# Patient Record
Sex: Female | Born: 1935 | Race: White | Hispanic: No | Marital: Single | Smoking: Never smoker
Health system: Southern US, Community
[De-identification: ages and names within clinical notes are randomized; demographics above are authoritative.]

## PROBLEM LIST (undated history)

## (undated) DIAGNOSIS — I471 Supraventricular tachycardia: Secondary | ICD-10-CM

## (undated) DIAGNOSIS — G20 Parkinson's disease: Secondary | ICD-10-CM

## (undated) HISTORY — PX: BREAST SURGERY: SHX581

---

## 1999-08-26 ENCOUNTER — Encounter: Payer: Self-pay | Admitting: Family Medicine

## 1999-08-26 ENCOUNTER — Encounter: Admission: RE | Admit: 1999-08-26 | Discharge: 1999-08-26 | Payer: Self-pay | Admitting: Family Medicine

## 2018-04-10 ENCOUNTER — Emergency Department (HOSPITAL_BASED_OUTPATIENT_CLINIC_OR_DEPARTMENT_OTHER): Payer: Medicare Other

## 2018-04-10 ENCOUNTER — Encounter (HOSPITAL_BASED_OUTPATIENT_CLINIC_OR_DEPARTMENT_OTHER): Payer: Self-pay | Admitting: Emergency Medicine

## 2018-04-10 ENCOUNTER — Other Ambulatory Visit: Payer: Self-pay

## 2018-04-10 ENCOUNTER — Emergency Department (HOSPITAL_BASED_OUTPATIENT_CLINIC_OR_DEPARTMENT_OTHER)
Admission: EM | Admit: 2018-04-10 | Discharge: 2018-04-10 | Disposition: A | Discharge status: Home / Self Care | Payer: Medicare Other | Attending: Emergency Medicine | Admitting: Emergency Medicine

## 2018-04-10 DIAGNOSIS — I480 Paroxysmal atrial fibrillation: Secondary | ICD-10-CM | POA: Insufficient documentation

## 2018-04-10 DIAGNOSIS — R079 Chest pain, unspecified: Secondary | ICD-10-CM | POA: Diagnosis present

## 2018-04-10 DIAGNOSIS — Z79899 Other long term (current) drug therapy: Secondary | ICD-10-CM | POA: Diagnosis not present

## 2018-04-10 DIAGNOSIS — G20 Parkinson's disease: Secondary | ICD-10-CM | POA: Diagnosis not present

## 2018-04-10 HISTORY — DX: Supraventricular tachycardia: I47.1

## 2018-04-10 HISTORY — DX: Parkinson's disease: G20

## 2018-04-10 LAB — CBC WITH DIFFERENTIAL/PLATELET
Abs Immature Granulocytes: 0.02 10*3/uL (ref 0.00–0.07)
BASOS ABS: 0 10*3/uL (ref 0.0–0.1)
Basophils Relative: 1 %
Eosinophils Absolute: 0.2 10*3/uL (ref 0.0–0.5)
Eosinophils Relative: 3 %
HCT: 43 % (ref 36.0–46.0)
Hemoglobin: 13.6 g/dL (ref 12.0–15.0)
Immature Granulocytes: 0 %
Lymphocytes Relative: 29 %
Lymphs Abs: 1.9 10*3/uL (ref 0.7–4.0)
MCH: 30.7 pg (ref 26.0–34.0)
MCHC: 31.6 g/dL (ref 30.0–36.0)
MCV: 97.1 fL (ref 80.0–100.0)
Monocytes Absolute: 0.6 10*3/uL (ref 0.1–1.0)
Monocytes Relative: 10 %
Neutro Abs: 3.6 10*3/uL (ref 1.7–7.7)
Neutrophils Relative %: 57 %
Platelets: 190 10*3/uL (ref 150–400)
RBC: 4.43 MIL/uL (ref 3.87–5.11)
RDW: 11.9 % (ref 11.5–15.5)
WBC: 6.3 10*3/uL (ref 4.0–10.5)
nRBC: 0 % (ref 0.0–0.2)

## 2018-04-10 LAB — TROPONIN I

## 2018-04-10 LAB — COMPREHENSIVE METABOLIC PANEL
ALT: <5 U/L (ref 0–44)
AST: 24 U/L (ref 15–41)
Albumin: 3.9 g/dL (ref 3.5–5.0)
Alkaline Phosphatase: 70 U/L (ref 38–126)
Anion gap: 9 (ref 5–15)
BUN: 22 mg/dL (ref 8–23)
CHLORIDE: 104 mmol/L (ref 98–111)
CO2: 26 mmol/L (ref 22–32)
Calcium: 9.6 mg/dL (ref 8.9–10.3)
Creatinine, Ser: 0.85 mg/dL (ref 0.44–1.00)
GFR calc Af Amer: >60 mL/min (ref >60)
GFR calc non Af Amer: >60 mL/min (ref >60)
Glucose, Bld: 102 mg/dL — ABNORMAL HIGH (ref 70–99)
Potassium: 3.8 mmol/L (ref 3.5–5.1)
Sodium: 139 mmol/L (ref 135–145)
Total Bilirubin: 0.8 mg/dL (ref 0.3–1.2)
Total Protein: 7.6 g/dL (ref 6.5–8.1)

## 2018-04-10 LAB — PROTIME-INR
INR: 0.98
Prothrombin Time: 12.9 seconds (ref 11.4–15.2)

## 2018-04-10 MED ORDER — APIXABAN 2.5 MG PO TABS
2.5000 mg | ORAL_TABLET | Freq: Once | ORAL | Status: AC
  Administered 2018-04-10: 2.5 mg via ORAL
  Filled 2018-04-10: qty 1

## 2018-04-10 MED ORDER — APIXABAN 2.5 MG PO TABS: 2.5000 mg | ORAL_TABLET | Freq: Two times a day (BID) | ORAL | 0 refills | Status: AC

## 2018-04-10 NOTE — ED Triage Notes (Signed)
Reports chest pain which began today. Reports pain in her jaw as well.  States "heart rate felt irregular".

## 2018-04-10 NOTE — Discharge Instructions (Signed)
If palpitations start again and you start feeling weak and your heart rate is elevated you can try to take half of 1 of your Lopressor.  Take the blood thinner Eliquis twice a day.  You have gotten your first dose tonight so you can start tomorrow.  This is to prevent strokes.

## 2018-04-10 NOTE — ED Provider Notes (Signed)
[facility] EMERGENCY DEPARTMENT Provider Note   CSN: [id number] Arrival date & time: 04/10/18  1900     History   Chief Complaint Chief Complaint  Patient presents with  . Chest Pain    HPI Julia Tran is a 83 y.o. female.  Patient is an 83 year old female with a history of Parkinson's disease, SVT presenting today because of jaw and shoulder discomfort as well as shortness of breath and generalized weakness.  Patient states she had had a normal day today until about 6:00 when she was sitting in the doctor's office with her sister.  She was not at the doctor for herself but was there with her nephew for his appointment.  She states it was very cold and there and she had been sitting there for approximately an hour when she started feeling a discomfort and ache in both jaws that then moved into both shoulders with generalized weakness some mild sweating and felt like she could not get a full breath.  She got home and checked her blood pressure and found her heart rate to be in the 120s.  She does take metoprolol twice daily and has not missed any doses.  She denies any recent medication changes and no reasons for dehydration.  She has been eating and drinking normally has had no cough, congestion, fever, abdominal pain.  She denied any chest pain today.  Currently here she states her symptoms have resolved and she just feels tired.  She denies any urinary symptoms or diarrhea.  She takes no anticoagulation.  The history is provided by the patient and a relative.  Chest Pain   This is a new problem. The current episode started 1 to 2 hours ago. The problem occurs constantly. The problem has been resolved. Associated with: started while sitting in the doctors office. The pain is at a severity of 4/10. The pain is moderate. The quality of the pain is described as pressure-like. Radiates to: pain was in bilateral jaws and shoulders. Duration of episode(s) is 1 hour. Associated  symptoms include diaphoresis, palpitations, shortness of breath and weakness. Pertinent negatives include no cough, no fever, no leg pain, no lower extremity edema, no nausea, no orthopnea, no sputum production, no syncope and no vomiting. She has tried rest for the symptoms. The treatment provided no relief.    Past Medical History:  Diagnosis Date  . Parkinson's disease (HCC)   . SVT (supraventricular tachycardia) (HCC)     There are no active problems to display for this patient.   Past Surgical History:  Procedure Laterality Date  . BREAST SURGERY       OB History   No obstetric history on file.      Home Medications    Prior to Admission medications   Medication Sig Start Date End Date Taking? Authorizing Provider  carbidopa-levodopa (SINEMET IR) 25-100 MG tablet Take 1 tablet by mouth 3 (three) times daily.   Yes [provider]  losartan (COZAAR) 100 MG tablet Take 100 mg by mouth daily.   Yes [provider]  metoprolol tartrate (LOPRESSOR) 25 MG tablet Take 25 mg by mouth 2 (two) times daily.   Yes [provider]  pravastatin (PRAVACHOL) 20 MG tablet Take 20 mg by mouth daily.   Yes [provider]  selegiline (ELDEPRYL) 5 MG tablet Take 5 mg by mouth 2 (two) times daily with a meal.   Yes [provider]    Family History History reviewed. No  pertinent family history.  Social History Social History   Tobacco Use  . Smoking status: Never Smoker  . Smokeless tobacco: Never Used  Substance Use Topics  . Alcohol use: Yes    Frequency: Never  . Drug use: Never     Allergies   Patient has no known allergies.   Review of Systems Review of Systems  Constitutional: Positive for diaphoresis. Negative for fever.  Respiratory: Positive for shortness of breath. Negative for cough and sputum production.   Cardiovascular: Positive for chest pain and palpitations. Negative for orthopnea and syncope.  Gastrointestinal:  Negative for nausea and vomiting.  Neurological: Positive for weakness.  All other systems reviewed and are negative.    Physical Exam Updated Vital Signs BP (!) 159/101   Pulse (!) 122   Temp 98.2 F (36.8 C) (Oral)   Resp 20   Ht 5\' 4"  (1.626 m)   Wt 52.2 kg   SpO2 98%   BMI 19.74 kg/m   Physical Exam Vitals signs and nursing note reviewed.  Constitutional:      General: She is not in acute distress.    Appearance: Normal appearance. She is well-developed.  HENT:     Head: Normocephalic and atraumatic.  Eyes:     Pupils: Pupils are equal, round, and reactive to light.  Cardiovascular:     Rate and Rhythm: Tachycardia present. Rhythm irregularly irregular.     Heart sounds: Normal heart sounds. No murmur. No friction rub.  Pulmonary:     Effort: Pulmonary effort is normal.     Breath sounds: Normal breath sounds. No wheezing or rales.  Abdominal:     General: Bowel sounds are normal. There is no distension.     Palpations: Abdomen is soft.     Tenderness: There is no abdominal tenderness. There is no guarding or rebound.  Musculoskeletal: Normal range of motion.        General: No tenderness.     Comments: No edema  Skin:    General: Skin is warm and dry.     Findings: No rash.  Neurological:     General: No focal deficit present.     Mental Status: She is alert and oriented to person, place, and time. Mental status is at baseline.     Cranial Nerves: No cranial nerve deficit.     Comments: Mild resting tremor in the right hand  Psychiatric:        Mood and Affect: Mood normal.        Behavior: Behavior normal.        Thought Content: Thought content normal.      ED Treatments / Results  Labs (all labs ordered are listed, but only abnormal results are displayed) Labs Reviewed  COMPREHENSIVE METABOLIC PANEL - Abnormal; Notable for the following components:      Result Value   Glucose, Bld 102 (*)    All other components within normal limits  CBC WITH  DIFFERENTIAL/PLATELET  PROTIME-INR  TROPONIN I    EKG EKG Interpretation  Date/Time:  Wednesday April 10 2018 19:43:05 EST Ventricular Rate:  82 PR Interval:    QRS Duration: 101 QT Interval:  376 QTC Calculation: 440 R Axis:   -33 Text Interpretation:  Sinus rhythm Ventricular premature complex Left axis deviation Atrial fibrillation with rapid ventricular response RESOLVED SINCE PREVIOUS Confirmed by Gwyneth Sprout ([id number]) on 04/10/2018 7:53:27 PM    Radiology Dg Chest Port 1 View  Result Date: 04/10/2018 CLINICAL DATA:  Chest  pain and dyspnea EXAM: PORTABLE CHEST 1 VIEW COMPARISON:  Right shoulder radiograph 11/21/2016 and CXR report 07/14/2000 FINDINGS: The heart size and mediastinal contours are within normal limits. Mild-to-moderate aortic atherosclerosis at the arch. No aneurysm. Both lungs are clear. The visualized skeletal structures are unremarkable. IMPRESSION: No active disease. Aortic atherosclerosis. Electronically Signed   By: Tollie Eth M.D.   On: 04/10/2018 20:02    Procedures Procedures (including critical care time)  Medications Ordered in ED Medications - No data to display   Initial Impression / Assessment and Plan / ED Course  I have reviewed the triage vital signs and the nursing notes.  Pertinent labs & imaging results that were available during my care of the patient were reviewed by me and considered in my medical decision making (see chart for details).     Elderly female presenting today with symptoms concerning for ACS versus A. fib RVR.  She has no symptoms concerning for infection, dissection, clot.  She was normal until 1 hour ago.  She is not currently having any further jaw or shoulder discomfort.  EKG shows heart rate in the 120s that goes up to the 150s that is irregular and consistent with A. fib RVR.  Discussed the findings with the patient and just prior to starting rate regulating medication patient converted to a sinus rhythm in  the 80s.  She does not take excessive caffeine has not had recent medication changes and has not missed any doses of her metoprolol.  CBC, CMP, troponin and coags are pending.  CHADVAS of 4.  Pt follows with cardiologist in [place] and recommended calling tomorrow for ongoing care.  Pt will be started on eliquis tonight.  No contraindication to anticoagulation at this time.  CHA2DS2/VAS Stroke Risk Points      N/A >= 2 Points: High Risk  1 - 1.99 Points: Medium Risk  0 Points: Low Risk    A final score could not be computed because of missing components.:   Last Change: N/A     This score determines the patient's risk of having a stroke if the  patient has atrial fibrillation.      This score is not applicable to this patient. Components are not  calculated.    8:37 PM 's are within normal limits.  X-ray without acute findings.  Patient feels back to her normal self now that her heart rate has gone back to a sinus rhythm.  Patient was started on Eliquis.  She will follow-up with her cardiologist.   Final Clinical Impressions(s) / ED Diagnoses   Final diagnoses:  PAF (paroxysmal atrial fibrillation) [facility])    ED Discharge Orders         Ordered    apixaban (ELIQUIS) 2.5 MG TABS tablet  2 times daily     04/10/18 2039           Gwyneth Sprout, MD 04/10/18 2040

## 2018-04-10 NOTE — ED Notes (Signed)
ED Provider at bedside. 

## 2019-07-19 IMAGING — DX DG CHEST 1V PORT
1 series · 1 of 1 positions shown · non-contrast
Comparison: Right shoulder radiograph 11/21/2016 and CXR report
07/14/2000

CLINICAL DATA: Chest pain and dyspnea

EXAM:
PORTABLE CHEST 1 VIEW

[chest ap]
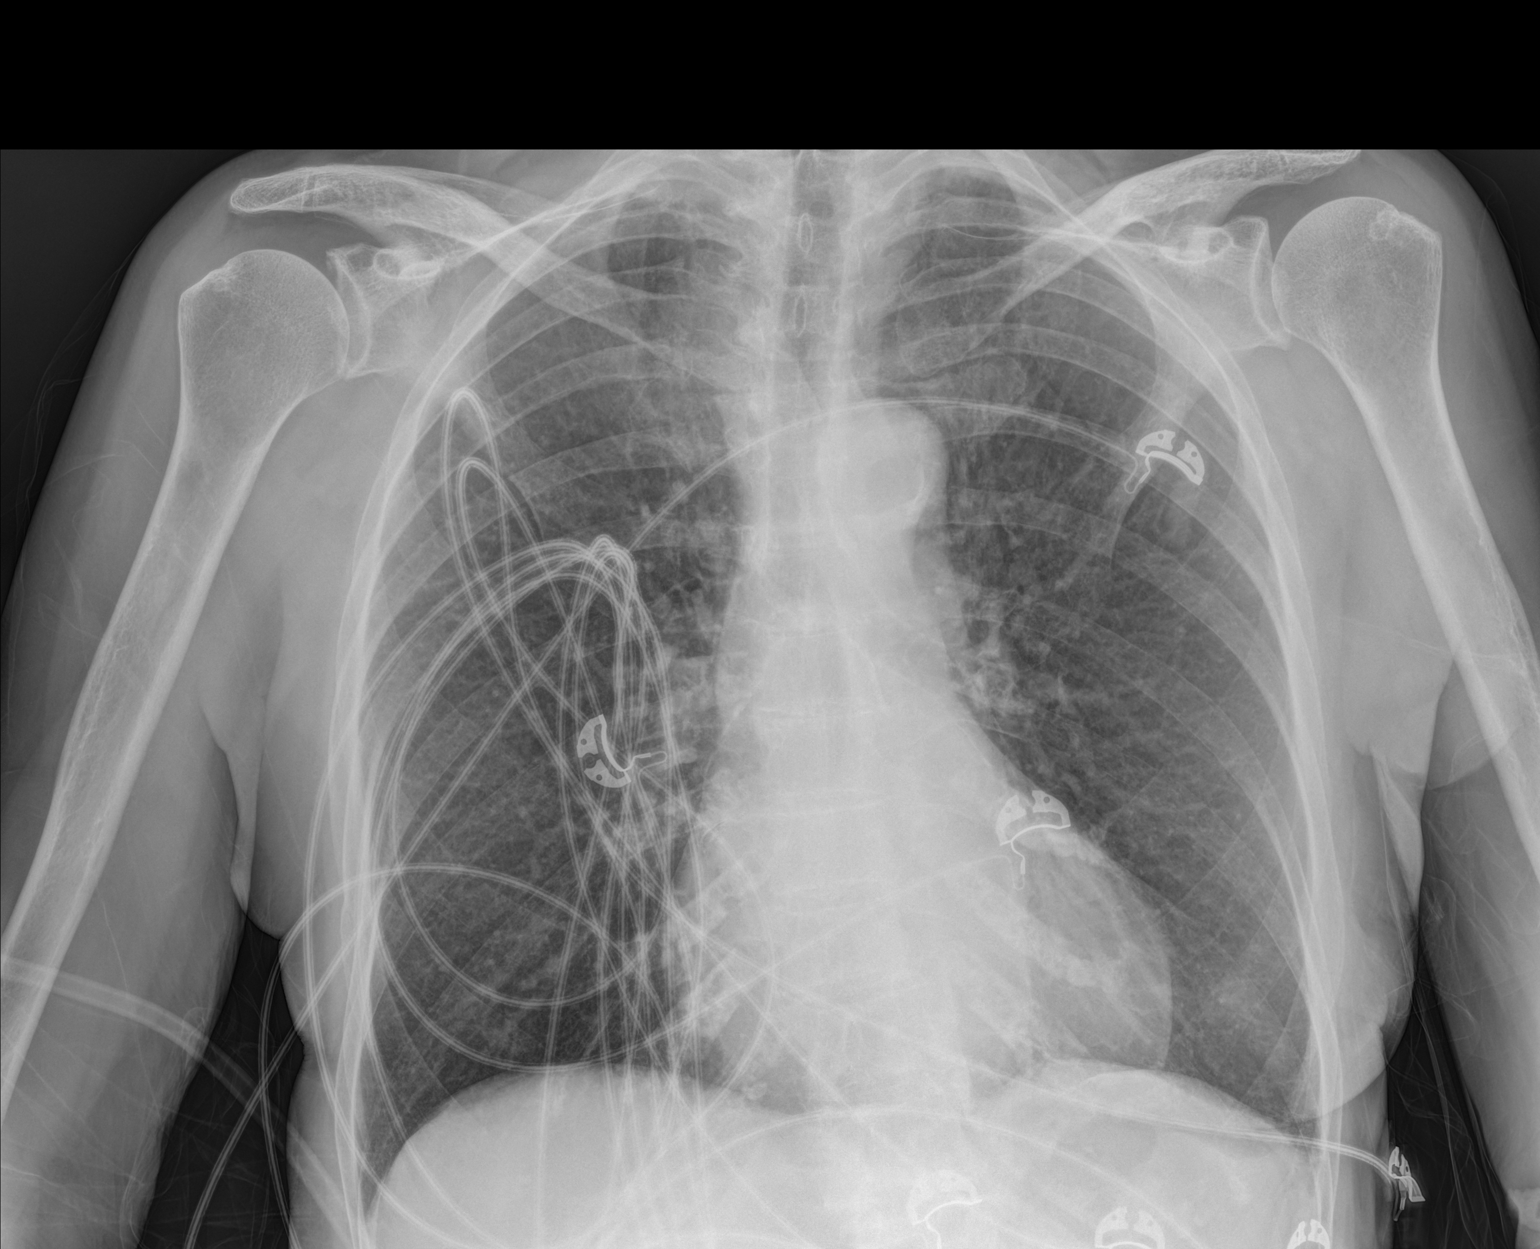

[1 of 1 positions shown; findings below may reference images not displayed]

FINDINGS: The heart size and mediastinal contours are within normal limits.
Mild-to-moderate aortic atherosclerosis at the arch. No aneurysm.
Both lungs are clear. The visualized skeletal structures are
unremarkable.
IMPRESSION: No active disease. Aortic atherosclerosis.

## 2019-09-23 DEATH — deceased
